# Patient Record
Sex: Male | Born: 1985 | Hispanic: No | Marital: Single | Smoking: Current every day smoker
Health system: Southern US, Community
[De-identification: ages and names within clinical notes are randomized; demographics above are authoritative.]

---

## 2017-09-26 ENCOUNTER — Other Ambulatory Visit: Payer: Self-pay

## 2017-09-26 ENCOUNTER — Emergency Department (HOSPITAL_COMMUNITY): Payer: Managed Care, Other (non HMO)

## 2017-09-26 ENCOUNTER — Encounter (HOSPITAL_COMMUNITY): Payer: Self-pay | Admitting: Emergency Medicine

## 2017-09-26 ENCOUNTER — Emergency Department (HOSPITAL_COMMUNITY)
Admission: EM | Admit: 2017-09-26 | Discharge: 2017-09-27 | Disposition: A | Discharge status: Home / Self Care | Payer: Managed Care, Other (non HMO) | Attending: Emergency Medicine | Admitting: Emergency Medicine

## 2017-09-26 DIAGNOSIS — Y9239 Other specified sports and athletic area as the place of occurrence of the external cause: Secondary | ICD-10-CM | POA: Diagnosis not present

## 2017-09-26 DIAGNOSIS — S01412A Laceration without foreign body of left cheek and temporomandibular area, initial encounter: Secondary | ICD-10-CM | POA: Insufficient documentation

## 2017-09-26 DIAGNOSIS — Y999 Unspecified external cause status: Secondary | ICD-10-CM | POA: Insufficient documentation

## 2017-09-26 DIAGNOSIS — W1830XA Fall on same level, unspecified, initial encounter: Secondary | ICD-10-CM | POA: Diagnosis not present

## 2017-09-26 DIAGNOSIS — S52125A Nondisplaced fracture of head of left radius, initial encounter for closed fracture: Secondary | ICD-10-CM | POA: Diagnosis not present

## 2017-09-26 DIAGNOSIS — Y9302 Activity, running: Secondary | ICD-10-CM | POA: Insufficient documentation

## 2017-09-26 DIAGNOSIS — F17210 Nicotine dependence, cigarettes, uncomplicated: Secondary | ICD-10-CM | POA: Diagnosis not present

## 2017-09-26 DIAGNOSIS — S0181XA Laceration without foreign body of other part of head, initial encounter: Secondary | ICD-10-CM | POA: Insufficient documentation

## 2017-09-26 MED ORDER — LIDOCAINE-EPINEPHRINE (PF) 2 %-1:200000 IJ SOLN
20.0000 mL | Freq: Once | INTRAMUSCULAR | Status: AC
  Administered 2017-09-26: 20 mL
  Filled 2017-09-26: qty 20

## 2017-09-26 MED ORDER — IBUPROFEN 200 MG PO TABS
600.0000 mg | ORAL_TABLET | Freq: Once | ORAL | Status: AC
  Administered 2017-09-26: 600 mg via ORAL
  Filled 2017-09-26: qty 3

## 2017-09-26 NOTE — ED Notes (Signed)
Patient transported to X-ray 

## 2017-09-26 NOTE — ED Triage Notes (Signed)
Pt states he was at the gym and fell  Pt states he broke his glasses and cut himself above and below his left eye  Bleeding controlled at this time

## 2017-09-26 NOTE — ED Provider Notes (Signed)
[facility]-EMERGENCY DEPT Provider Note   CSN: [id number] Arrival date & time: 09/26/17  2100     History   Chief Complaint Chief Complaint  Patient presents with  . Fall  . Facial Laceration    HPI Larry Mckenzie is a 32 y.o. male.  Patient presents the emergency department with complaint of lacerations to the left face as well as left elbow pain sustained acutely just prior to arrival when he was sprinting and fell.  Patient was wearing glasses and when he struck his face they caused a laceration above and below his left eye.  He did not lose consciousness.  He has not had any vomiting or vision change.  No other facial pain.  He complains of left elbow pain and tenderness but is able to fully range his elbow.  No treatments prior to arrival. Tetanus UTD 3 years ago.       History reviewed. No pertinent past medical history.  There are no active problems to display for this patient.   History reviewed. No pertinent surgical history.     Home Medications    Prior to Admission medications   Not on File    Family History Family History  Problem Relation Age of Onset  . Diabetes Mother     Social History Social History   Tobacco Use  . Smoking status: Current Every Day Smoker  . Smokeless tobacco: Never Used  Substance Use Topics  . Alcohol use: Yes  . Drug use: No     Allergies   Patient has no known allergies.   Review of Systems Review of Systems  Constitutional: Negative for fatigue.  HENT: Negative for tinnitus.   Eyes: Negative for photophobia, pain and visual disturbance.  Respiratory: Negative for shortness of breath.   Cardiovascular: Negative for chest pain.  Gastrointestinal: Negative for nausea and vomiting.  Musculoskeletal: Positive for arthralgias. Negative for back pain, gait problem, joint swelling and neck pain.  Skin: Positive for wound.  Neurological: Negative for dizziness, weakness, light-headedness,  numbness and headaches.  Psychiatric/Behavioral: Negative for confusion and decreased concentration.     Physical Exam Updated Vital Signs BP 110/67 (BP Location: Right Arm)   Pulse 81   Temp (!) 97.5 F (36.4 C) (Oral)   Resp 16   SpO2 98%   Physical Exam  Constitutional: He is oriented to person, place, and time. He appears well-developed and well-nourished.  HENT:  Head: Normocephalic and atraumatic. Head is without raccoon's eyes and without Battle's sign.  Right Ear: Tympanic membrane, external ear and ear canal normal. No hemotympanum.  Left Ear: Tympanic membrane, external ear and ear canal normal. No hemotympanum.  Nose: Nose normal. No nasal septal hematoma.  Mouth/Throat: Oropharynx is clear and moist.  3 cm laceration to the L eyebrow, clean, moderately gaping  2 cm laceration to the L cheek, clean, minimally gaping  Eyes: Conjunctivae, EOM and lids are normal. Pupils are equal, round, and reactive to light. Right eye exhibits no discharge. Left eye exhibits no discharge.  No visible hyphema  Neck: Normal range of motion. Neck supple.  Cardiovascular: Normal rate, regular rhythm and normal heart sounds.  Pulmonary/Chest: Effort normal and breath sounds normal.  Abdominal: Soft. There is no tenderness.  Musculoskeletal:       Left shoulder: Normal.       Left elbow: He exhibits decreased range of motion (slightly decreased flexion) and effusion. Tenderness found. Radial head and olecranon process tenderness noted.  Left wrist: Normal.       Cervical back: He exhibits normal range of motion, no tenderness and no bony tenderness.       Thoracic back: He exhibits no tenderness and no bony tenderness.       Lumbar back: He exhibits no tenderness and no bony tenderness.  Neurological: He is alert and oriented to person, place, and time. He has normal strength and normal reflexes. No cranial nerve deficit or sensory deficit. Coordination normal. GCS eye subscore is 4.  GCS verbal subscore is 5. GCS motor subscore is 6.  Skin: Skin is warm and dry.  Psychiatric: He has a normal mood and affect.  Nursing note and vitals reviewed.    ED Treatments / Results  Labs (all labs ordered are listed, but only abnormal results are displayed) Labs Reviewed - No data to display  EKG  EKG Interpretation None       Radiology No results found.  Procedures .Marland Kitchen.Laceration Repair Date/Time: 09/27/2017 12:46 AM Performed by: Renne CriglerGeiple, Gaia Gullikson, PA-C Authorized by: Renne CriglerGeiple, Valen Mascaro, PA-C   Consent:    Consent obtained:  Verbal   Consent given by:  Patient   Risks discussed:  Pain, poor cosmetic result, need for additional repair and infection   Alternatives discussed:  No treatment Anesthesia (see MAR for exact dosages):    Anesthesia method:  Local infiltration   Local anesthetic:  Lidocaine 2% WITH epi Laceration details:    Location:  Face   Face location:  L eyebrow   Length (cm):  3 Repair type:    Repair type:  Simple Pre-procedure details:    Preparation:  Patient was prepped and draped in usual sterile fashion Exploration:    Hemostasis achieved with:  Epinephrine and direct pressure   Wound exploration: wound explored through full range of motion and entire depth of wound probed and visualized     Contaminated: no   Treatment:    Area cleansed with:  Shur-Clens   Amount of cleaning:  Standard Skin repair:    Repair method:  Sutures   Suture size:  5-0   Suture material:  Nylon   Suture technique:  Simple interrupted   Number of sutures:  4 Approximation:    Approximation:  Close Post-procedure details:    Dressing:  Open (no dressing)   Patient tolerance of procedure:  Tolerated well, no immediate complications .Marland Kitchen.Laceration Repair Date/Time: 09/27/2017 12:47 AM Performed by: Renne CriglerGeiple, Laityn Bensen, PA-C Authorized by: Renne CriglerGeiple, Brannen Koppen, PA-C   Consent:    Consent obtained:  Verbal   Consent given by:  Patient   Risks discussed:  Infection, need  for additional repair, poor cosmetic result, poor wound healing and pain   Alternatives discussed:  No treatment Anesthesia (see MAR for exact dosages):    Anesthesia method:  Local infiltration   Local anesthetic:  Lidocaine 2% WITH epi Laceration details:    Location:  Face   Face location:  L cheek   Length (cm):  2 Repair type:    Repair type:  Simple Pre-procedure details:    Preparation:  Patient was prepped and draped in usual sterile fashion Exploration:    Hemostasis achieved with:  Epinephrine and LET   Wound exploration: wound explored through full range of motion and entire depth of wound probed and visualized     Contaminated: no   Treatment:    Area cleansed with:  Shur-Clens   Amount of cleaning:  Standard Skin repair:    Repair method:  Sutures  Suture size:  6-0   Suture material:  Nylon   Suture technique:  Simple interrupted   Number of sutures:  6 Approximation:    Approximation:  Close Post-procedure details:    Dressing:  Open (no dressing)   Patient tolerance of procedure:  Tolerated well, no immediate complications   (including critical care time)  Medications Ordered in ED Medications  lidocaine-EPINEPHrine (XYLOCAINE W/EPI) 2 %-1:200000 (PF) injection 20 mL (not administered)     Initial Impression / Assessment and Plan / ED Course  I have reviewed the triage vital signs and the nursing notes.  Pertinent labs & imaging results that were available during my care of the patient were reviewed by me and considered in my medical decision making (see chart for details).     Patient seen and examined. Work-up initiated. Medications ordered.   Vital signs reviewed and are as follows: BP 124/85 (BP Location: Right Arm)   Pulse 81   Temp (!) 97.5 F (36.4 C) (Oral)   Resp 15   SpO2 99%   12:45 AM patient updated on results.  Given sling for comfort.  Discussed early range of motion exercises and need for orthopedic follow-up.  Patient  counseled on wound care. Patient counseled on need to return or see PCP/urgent care for suture removal in 4-5 days. Patient was urged to return to the Emergency Department urgently with worsening pain, swelling, expanding erythema especially if it streaks away from the affected area, fever, or if they have any other concerns. Patient verbalized understanding.    Final Clinical Impressions(s) / ED Diagnoses   Final diagnoses:  Facial laceration, initial encounter  Closed nondisplaced fracture of head of left radius, initial encounter   Facial lacerations: Repaired without complication.  Do not suspect muscular damage.  Do not suspect significant nerve injury.  Full range of motion of face and forehead.  Radial head fracture: Patient given a sling for comfort.  Encouraged rice protocol, early mobilization, follow-up with orthopedics.  Referral given.  ED Discharge Orders        Ordered    naproxen (NAPROSYN) 500 MG tablet  2 times daily     09/27/17 0043       Renne Crigler, PA-C 09/27/17 1610    Geoffery Lyons, MD 09/27/17 [phone]

## 2017-09-27 MED ORDER — NAPROXEN 500 MG PO TABS: 500.0000 mg | ORAL_TABLET | Freq: Two times a day (BID) | ORAL | 0 refills | Status: AC

## 2017-09-27 NOTE — Discharge Instructions (Signed)
Please read and follow all provided instructions.  Your diagnoses today include:  1. Facial laceration, initial encounter   2. Closed nondisplaced fracture of head of left radius, initial encounter     Tests performed today include:  X-ray of the affected area that showed a non-displaced radial head fracture in your elbow  Vital signs. See below for your results today.   Medications prescribed:   Naproxen - anti-inflammatory pain medication  Do not exceed 500mg  naproxen every 12 hours, take with food  You have been prescribed an anti-inflammatory medication or NSAID. Take with food. Take smallest effective dose for the shortest duration needed for your pain. Stop taking if you experience stomach pain or vomiting.   Take any prescribed medications only as directed.   Home care instructions:  Follow any educational materials and wound care instructions contained in this packet.   Keep affected area above the level of your heart when possible to minimize swelling. Wash area gently twice a day with warm soapy water. Do not apply alcohol or hydrogen peroxide. Cover the area if it draining or weeping.   Follow-up instructions: Suture Removal: Return to the Emergency Department or see your primary care care doctor in 4-5 days for a recheck of your wound and removal of your sutures or staples.    Follow-up with the orthopedic doctor listed in the next 1 week for re-exam.  Return instructions:  Return to the Emergency Department if you have:  Fever  Worsening pain  Worsening swelling of the wound  Pus draining from the wound  Redness of the skin that moves away from the wound, especially if it streaks away from the affected area   Any other emergent concerns  Your vital signs today were: BP 124/85 (BP Location: Right Arm)    Pulse 81    Temp (!) 97.5 F (36.4 C) (Oral)    Resp 15    SpO2 99%  If your blood pressure (BP) was elevated above 135/85 this visit, please have this  repeated by your doctor within one month. --------------

## 2017-10-01 ENCOUNTER — Ambulatory Visit (HOSPITAL_COMMUNITY)
Admission: EM | Admit: 2017-10-01 | Discharge: 2017-10-01 | Disposition: A | Discharge status: Home / Self Care | Payer: Managed Care, Other (non HMO)

## 2017-10-01 ENCOUNTER — Encounter (HOSPITAL_COMMUNITY): Payer: Self-pay | Admitting: *Deleted

## 2017-10-01 NOTE — ED Triage Notes (Signed)
Presents for removal left facial sutures placed 09/26/17.  No S/S infection.  Wound well-approximated.  Sutures intact.

## 2019-03-18 IMAGING — CR DG ELBOW COMPLETE 3+V*L*
4 series · 4 of 4 positions shown · non-contrast
Comparison: None.

CLINICAL DATA: Left elbow pain after fall at gym.

EXAM:
LEFT ELBOW - COMPLETE 3+ VIEW

[x elbow ap left]
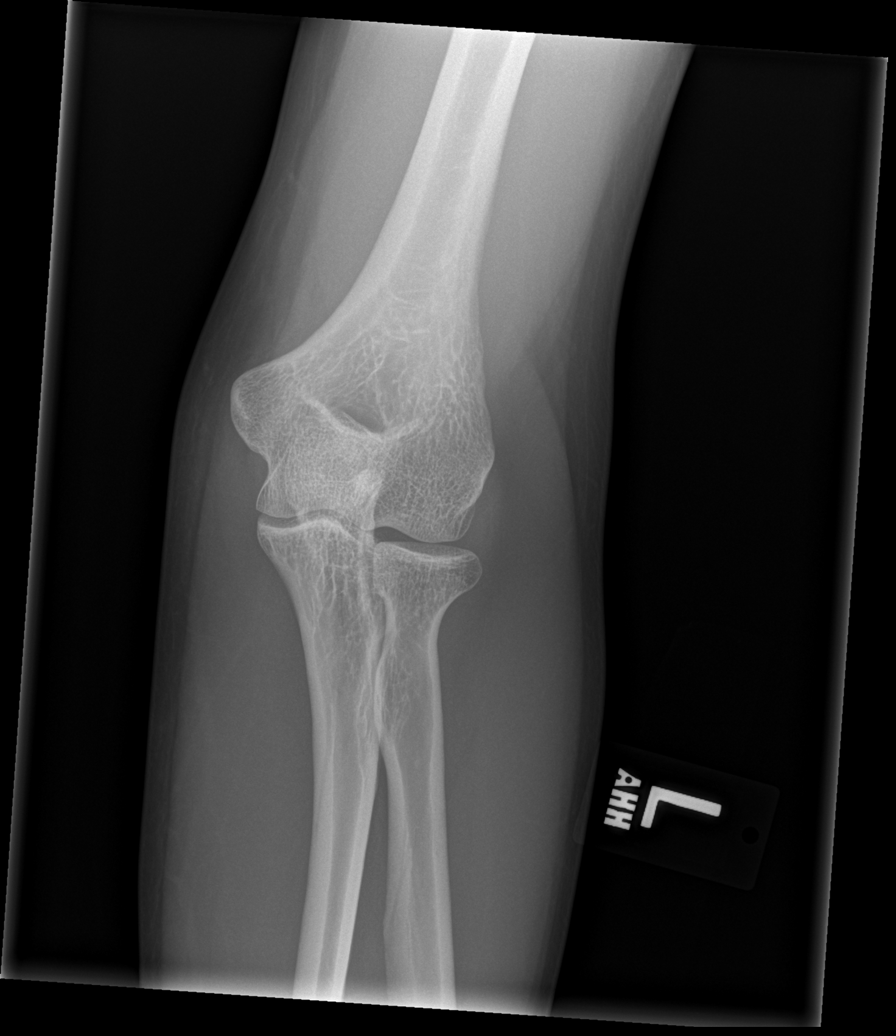

[x elbow obl left (1 of 2)]
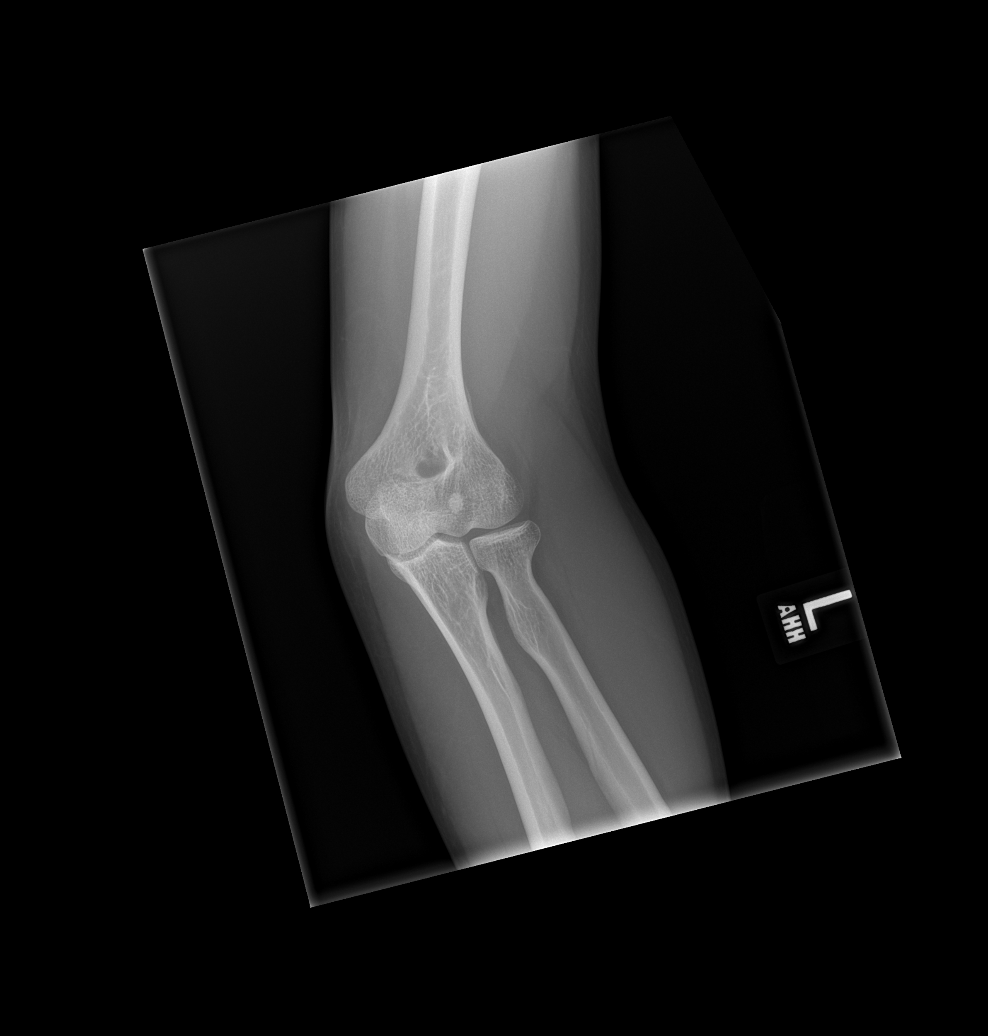

[x elbow obl left (2 of 2)]
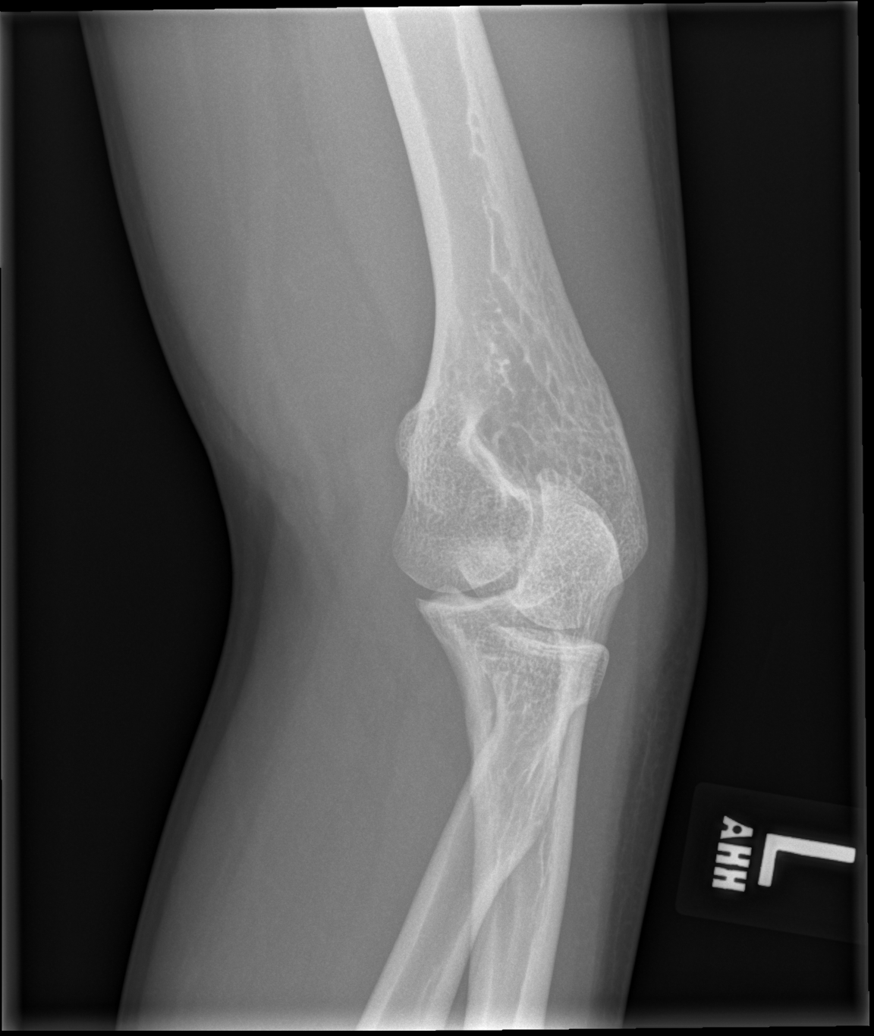

[x elbow lat left]
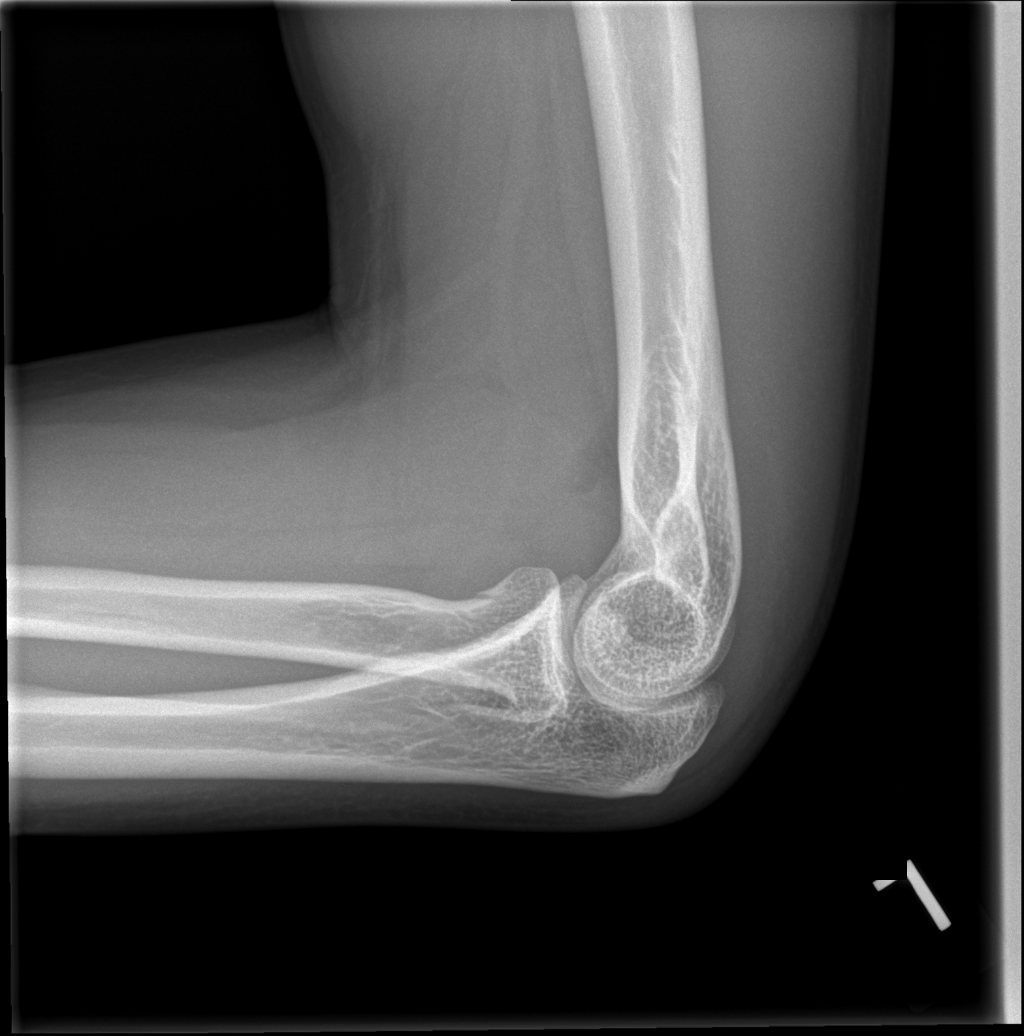

[4 of 4 positions shown; findings below may reference images not displayed]

FINDINGS: Acute, nondisplaced radial head fracture with associated elbow joint
effusion characterized by an elevated fat pad sign noted. No joint
dislocation.
IMPRESSION: Acute nondisplaced radial head fracture with joint effusion. No
joint dislocation.
